# Patient Record
Sex: Female | Born: 1950 | Race: White | Hispanic: No | Marital: Single
Health system: Southern US, Community
[De-identification: ages and names within clinical notes are randomized; demographics above are authoritative.]

---

## 2008-01-09 ENCOUNTER — Ambulatory Visit: Payer: Self-pay | Admitting: Ophthalmology

## 2008-06-28 ENCOUNTER — Inpatient Hospital Stay: Payer: Self-pay | Admitting: Internal Medicine

## 2008-07-03 ENCOUNTER — Inpatient Hospital Stay: Payer: Self-pay | Admitting: Internal Medicine

## 2008-08-03 ENCOUNTER — Inpatient Hospital Stay: Payer: Self-pay | Admitting: Internal Medicine

## 2010-10-12 ENCOUNTER — Ambulatory Visit: Payer: Self-pay | Admitting: Oncology

## 2010-10-29 ENCOUNTER — Inpatient Hospital Stay: Payer: Self-pay | Admitting: Internal Medicine

## 2010-11-10 ENCOUNTER — Ambulatory Visit: Payer: Self-pay | Admitting: Oncology

## 2010-12-07 ENCOUNTER — Ambulatory Visit: Payer: Self-pay | Admitting: Gastroenterology

## 2010-12-12 LAB — PATHOLOGY REPORT

## 2010-12-15 ENCOUNTER — Emergency Department: Payer: Self-pay | Admitting: Emergency Medicine

## 2011-05-12 ENCOUNTER — Ambulatory Visit: Payer: Self-pay | Admitting: Physician Assistant

## 2012-01-01 IMAGING — NM NUCLEAR MEDICINE WHOLE BODY BONE SCINTIGRAPHY
4 series · 16 of 16 positions shown · non-contrast
Comparison: none

RESULT:      The patient received a dose of 21.32 mCi of technetium 99 M
MDP. Anterior and posterior whole body images are obtained. The patient has
no previous exam for comparison.

There is a history of bilateral hip replacements. Is a history of abnormal
radiographic bone survey. Images include oblique views over the thoracic
region and lateral views of the calvarium and cervical spine and
demonstrates increased localization in the anterior left fifth rib near the
costochondral junction which is nonspecific. There is increased localization
symmetrically in the shoulders and in the nasal and left ethmoid region and
in the knees, left greater than right as well is in the feet and ankles. The
changes in the joints appear to be most likely secondary to degenerative
disease. The appearance in the costochondral junction is nonspecific.
Specifically, no abnormal localization is seen in the area of the distal
femur. The forearms are not completely included. No abnormal localization is
suggested in the calvarium on the right lateral view. There is some minimal
increased localization in the frontal region on the left lateral view
superiorly and anteriorly. This is not seen normal on the frontal or
posterior views. Degenerative uptake is seen in the cervical spine
especially to the left. There is minimal increased localization in a
posterior left rib that appears to be the eighth rib.

[Series 1000: statics (reformatted series) · 2.40mm/px · 3 acquisitions, 6 frames shown]
[im 1/3]
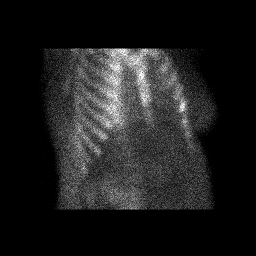
[im 1/3]
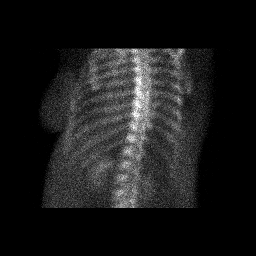
[im 2/3]
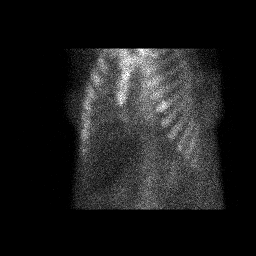
[im 2/3]
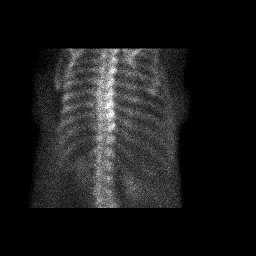
[im 3/3]
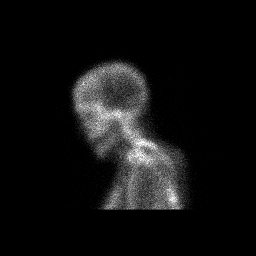
[im 3/3]
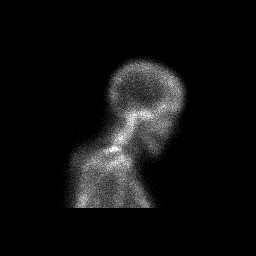

[Series 1000: 3 hr wholebody · 2.40mm/px · 2 of 2 frames shown]
[frame 1/2]
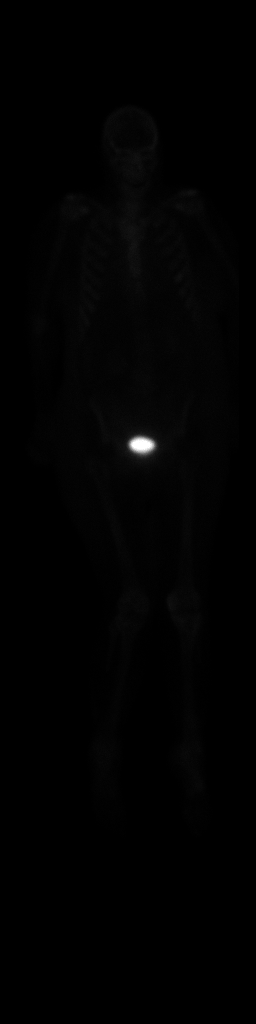
[frame 2/2]
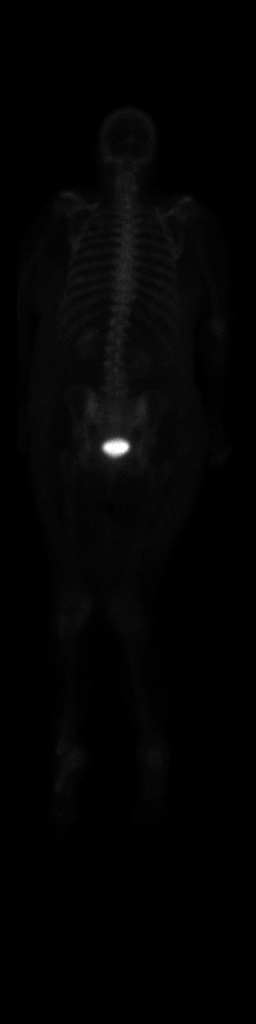

[Series 1000: 3 hr wholebody (reformatted series) · 2.40mm/px · 2 of 2 frames shown]
[frame 1/2]
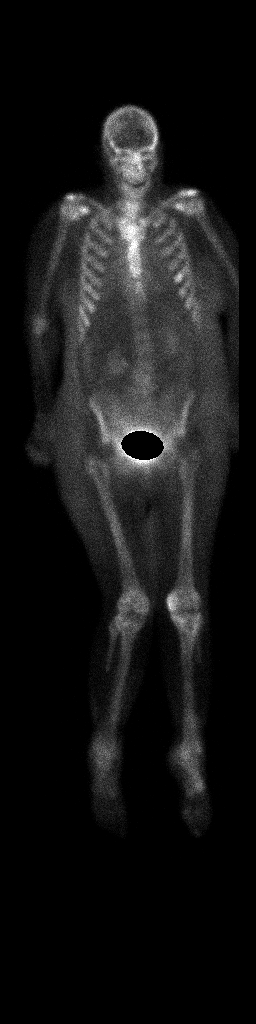
[frame 2/2]
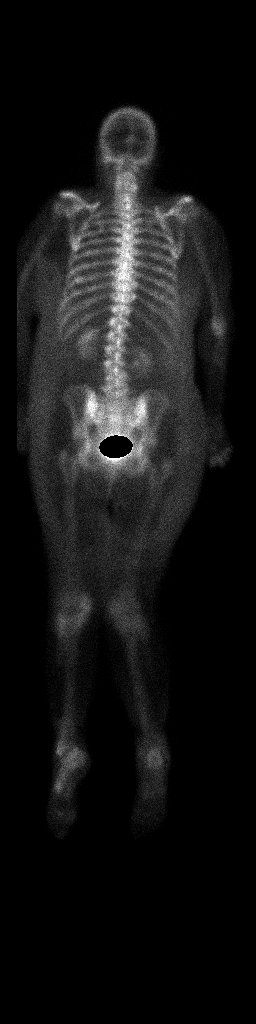

[Series 1000: statics · 2.40mm/px · 3 acquisitions, 6 frames shown]
[im 1/3]
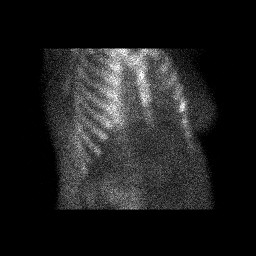
[im 1/3]
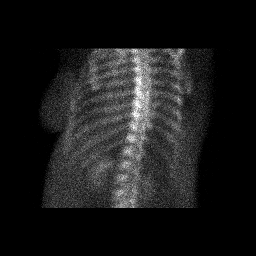
[im 2/3]
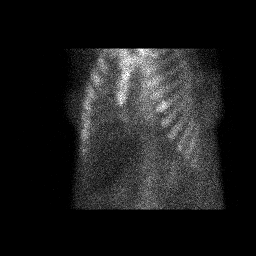
[im 2/3]
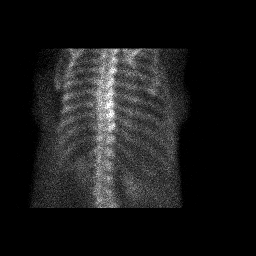
[im 3/3]
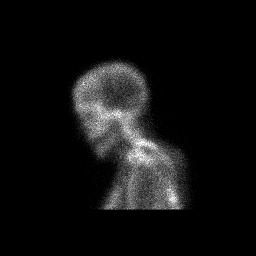
[im 3/3]
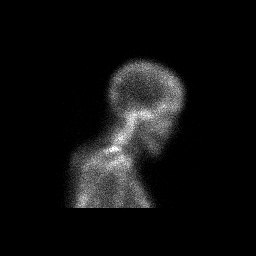

[16 of 16 positions shown; findings below may reference images not displayed]

IMPRESSION: 1. Nonspecific areas of localization. The changes in the joints in the
shoulders, knees, ankles and feet in the cervical spine are likely
degenerative. The findings in the calvarium and ribs are nonspecific. No
abnormal localization is seen in the extremities other than the degenerative
joint changes but the forearms are incompletely included. Metastatic disease
is not completely excluded. Is there a known primary malignancy? The changes
could be secondary to costochondritis or old rib trauma. Clinical
correlation and followup is recommended.

## 2012-06-10 ENCOUNTER — Inpatient Hospital Stay: Payer: Self-pay | Admitting: Student

## 2012-06-10 LAB — COMPREHENSIVE METABOLIC PANEL
Albumin: 2 g/dL — ABNORMAL LOW (ref 3.4–5.0)
Anion Gap: 11 (ref 7–16)
Bilirubin,Total: 1.2 mg/dL — ABNORMAL HIGH (ref 0.2–1.0)
Calcium, Total: 7.3 mg/dL — ABNORMAL LOW (ref 8.5–10.1)
Chloride: 89 mmol/L — ABNORMAL LOW (ref 98–107)
EGFR (African American): >60
Osmolality: 268 (ref 275–301)
Potassium: 3.4 mmol/L — ABNORMAL LOW (ref 3.5–5.1)
SGOT(AST): 26 U/L (ref 15–37)
Sodium: 135 mmol/L — ABNORMAL LOW (ref 136–145)
Total Protein: 5.8 g/dL — ABNORMAL LOW (ref 6.4–8.2)

## 2012-06-10 LAB — CBC
HGB: 10.7 g/dL — ABNORMAL LOW (ref 12.0–16.0)
MCHC: 36 g/dL (ref 32.0–36.0)
Platelet: 115 10*3/uL — ABNORMAL LOW (ref 150–440)
RBC: 2.26 10*6/uL — ABNORMAL LOW (ref 3.80–5.20)

## 2012-06-10 LAB — IRON AND TIBC
Iron Bind.Cap.(Total): 138 ug/dL — ABNORMAL LOW (ref 250–450)
Iron Saturation: 96 %
Iron: 133 ug/dL (ref 50–170)
Unbound Iron-Bind.Cap.: 5 ug/dL

## 2012-06-10 LAB — TSH: Thyroid Stimulating Horm: 0.615 u[IU]/mL

## 2012-06-10 LAB — FOLATE: Folic Acid: 1.1 ng/mL — ABNORMAL LOW (ref 3.1–100.0)

## 2012-06-11 LAB — CBC WITH DIFFERENTIAL/PLATELET
Eosinophil #: 0 10*3/uL (ref 0.0–0.7)
HCT: 22.5 % — ABNORMAL LOW (ref 35.0–47.0)
Lymphocyte #: 0.7 10*3/uL — ABNORMAL LOW (ref 1.0–3.6)
Lymphocyte %: 19.8 %
MCHC: 35.2 g/dL (ref 32.0–36.0)
Monocyte #: 0.2 x10 3/mm (ref 0.2–0.9)
Monocyte %: 4.5 %
Neutrophil #: 2.8 10*3/uL (ref 1.4–6.5)
RBC: 1.72 10*6/uL — ABNORMAL LOW (ref 3.80–5.20)
RDW: 15.9 % — ABNORMAL HIGH (ref 11.5–14.5)
WBC: 3.7 10*3/uL (ref 3.6–11.0)

## 2012-06-11 LAB — BASIC METABOLIC PANEL
Anion Gap: 10 (ref 7–16)
Chloride: 99 mmol/L (ref 98–107)
Co2: 28 mmol/L (ref 21–32)
Creatinine: 0.44 mg/dL — ABNORMAL LOW (ref 0.60–1.30)
EGFR (African American): >60
Osmolality: 270 (ref 275–301)
Sodium: 137 mmol/L (ref 136–145)

## 2012-06-12 LAB — HEPATIC FUNCTION PANEL A (ARMC)
Bilirubin, Direct: 0.2 mg/dL (ref 0.00–0.20)
SGOT(AST): 27 U/L (ref 15–37)
SGPT (ALT): 7 U/L — ABNORMAL LOW (ref 12–78)
Total Protein: 4.6 g/dL — ABNORMAL LOW (ref 6.4–8.2)

## 2012-06-12 LAB — CBC WITH DIFFERENTIAL/PLATELET
Basophil #: 0 10*3/uL (ref 0.0–0.1)
Eosinophil #: 0.1 10*3/uL (ref 0.0–0.7)
Eosinophil %: 2.9 %
HCT: 29.6 % — ABNORMAL LOW (ref 35.0–47.0)
HGB: 10.3 g/dL — ABNORMAL LOW (ref 12.0–16.0)
Lymphocyte %: 23.9 %
MCH: 41.5 pg — ABNORMAL HIGH (ref 26.0–34.0)
MCHC: 35 g/dL (ref 32.0–36.0)
Monocyte #: 0.2 x10 3/mm (ref 0.2–0.9)
Neutrophil %: 67.3 %
Platelet: 92 10*3/uL — ABNORMAL LOW (ref 150–440)

## 2012-06-12 LAB — BASIC METABOLIC PANEL
BUN: 6 mg/dL — ABNORMAL LOW (ref 7–18)
Calcium, Total: 6.5 mg/dL — CL (ref 8.5–10.1)
Chloride: 107 mmol/L (ref 98–107)
Co2: 25 mmol/L (ref 21–32)
Creatinine: 0.6 mg/dL (ref 0.60–1.30)
EGFR (African American): >60
Osmolality: 276 (ref 275–301)
Potassium: 5.2 mmol/L — ABNORMAL HIGH (ref 3.5–5.1)

## 2012-06-13 LAB — HEMOGLOBIN: HGB: 10.8 g/dL — ABNORMAL LOW (ref 12.0–16.0)

## 2012-06-13 LAB — BASIC METABOLIC PANEL
Anion Gap: 5 — ABNORMAL LOW (ref 7–16)
Co2: 27 mmol/L (ref 21–32)
Creatinine: 0.73 mg/dL (ref 0.60–1.30)
EGFR (African American): >60
EGFR (Non-African Amer.): >60
Glucose: 79 mg/dL (ref 65–99)
Potassium: 4.6 mmol/L (ref 3.5–5.1)
Sodium: 138 mmol/L (ref 136–145)

## 2012-06-14 ENCOUNTER — Observation Stay: Payer: Self-pay | Admitting: Internal Medicine

## 2012-06-14 LAB — URINALYSIS, COMPLETE
Glucose,UR: NEGATIVE mg/dL (ref 0–75)
Ketone: NEGATIVE
Nitrite: POSITIVE
Ph: 6 (ref 4.5–8.0)
RBC,UR: 5 /HPF (ref 0–5)
Squamous Epithelial: 1
WBC UR: 23 /HPF (ref 0–5)

## 2012-06-14 LAB — COMPREHENSIVE METABOLIC PANEL
Albumin: 1.8 g/dL — ABNORMAL LOW (ref 3.4–5.0)
Alkaline Phosphatase: 180 U/L — ABNORMAL HIGH (ref 50–136)
Anion Gap: 8 (ref 7–16)
BUN: 4 mg/dL — ABNORMAL LOW (ref 7–18)
Bilirubin,Total: 0.8 mg/dL (ref 0.2–1.0)
Calcium, Total: 7.6 mg/dL — ABNORMAL LOW (ref 8.5–10.1)
Chloride: 98 mmol/L (ref 98–107)
Creatinine: 0.73 mg/dL (ref 0.60–1.30)
EGFR (African American): >60
Glucose: 87 mg/dL (ref 65–99)
Potassium: 3.3 mmol/L — ABNORMAL LOW (ref 3.5–5.1)
Total Protein: 5.4 g/dL — ABNORMAL LOW (ref 6.4–8.2)

## 2012-06-14 LAB — CBC
HCT: 34.1 % — ABNORMAL LOW (ref 35.0–47.0)
HGB: 11.8 g/dL — ABNORMAL LOW (ref 12.0–16.0)
MCH: 40.5 pg — ABNORMAL HIGH (ref 26.0–34.0)
MCHC: 34.7 g/dL (ref 32.0–36.0)
MCV: 117 fL — ABNORMAL HIGH (ref 80–100)
RDW: 27.6 % — ABNORMAL HIGH (ref 11.5–14.5)

## 2012-06-15 LAB — POTASSIUM: Potassium: 3.1 mmol/L — ABNORMAL LOW (ref 3.5–5.1)

## 2012-06-16 LAB — BASIC METABOLIC PANEL
Anion Gap: 10 (ref 7–16)
Calcium, Total: 7.5 mg/dL — ABNORMAL LOW (ref 8.5–10.1)
Co2: 25 mmol/L (ref 21–32)
EGFR (African American): >60
EGFR (Non-African Amer.): >60
Glucose: 85 mg/dL (ref 65–99)
Osmolality: 276 (ref 275–301)
Potassium: 4.2 mmol/L (ref 3.5–5.1)

## 2012-06-16 LAB — URINE CULTURE

## 2012-06-17 LAB — CBC WITH DIFFERENTIAL/PLATELET
Basophil #: 0 10*3/uL (ref 0.0–0.1)
Basophil %: 0.7 %
Basophil %: 0.6 %
Eosinophil %: 5.8 %
Eosinophil %: 3.9 %
HCT: 30.8 % — ABNORMAL LOW (ref 35.0–47.0)
HGB: 9.6 g/dL — ABNORMAL LOW (ref 12.0–16.0)
Lymphocyte %: 17.9 %
Lymphocyte %: 16.3 %
MCH: 40.2 pg — ABNORMAL HIGH (ref 26.0–34.0)
MCHC: 35.6 g/dL (ref 32.0–36.0)
MCV: 116 fL — ABNORMAL HIGH (ref 80–100)
Monocyte #: 0.9 x10 3/mm (ref 0.2–0.9)
Monocyte %: 13.4 %
Monocyte %: 10.7 %
Neutrophil #: 5.6 10*3/uL (ref 1.4–6.5)
Neutrophil %: 62.2 %
Platelet: 219 10*3/uL (ref 150–440)
Platelet: 375 10*3/uL (ref 150–440)
RBC: 2.32 10*6/uL — ABNORMAL LOW (ref 3.80–5.20)
RBC: 2.65 10*6/uL — ABNORMAL LOW (ref 3.80–5.20)
RDW: 26 % — ABNORMAL HIGH (ref 11.5–14.5)

## 2013-04-23 ENCOUNTER — Emergency Department: Payer: Self-pay | Admitting: Emergency Medicine

## 2013-05-09 ENCOUNTER — Observation Stay: Payer: Self-pay | Admitting: Internal Medicine

## 2013-05-09 LAB — BASIC METABOLIC PANEL
Anion Gap: 9 (ref 7–16)
BUN: 6 mg/dL — ABNORMAL LOW (ref 7–18)
Calcium, Total: 9 mg/dL (ref 8.5–10.1)
Chloride: 100 mmol/L (ref 98–107)
EGFR (African American): >60
EGFR (Non-African Amer.): >60
Glucose: 77 mg/dL (ref 65–99)
Osmolality: 268 (ref 275–301)
Potassium: 4.1 mmol/L (ref 3.5–5.1)

## 2013-05-09 LAB — URINALYSIS, COMPLETE
Bacteria: NONE SEEN
Bilirubin,UR: NEGATIVE
Blood: NEGATIVE
Glucose,UR: NEGATIVE mg/dL (ref 0–75)
Nitrite: NEGATIVE
Ph: 5 (ref 4.5–8.0)
Protein: NEGATIVE
RBC,UR: NONE SEEN /HPF (ref 0–5)
Squamous Epithelial: NONE SEEN
WBC UR: <1 /HPF (ref 0–5)

## 2013-05-09 LAB — CBC
HCT: 37 % (ref 35.0–47.0)
HGB: 12.9 g/dL (ref 12.0–16.0)
MCH: 35.5 pg — ABNORMAL HIGH (ref 26.0–34.0)
MCHC: 34.8 g/dL (ref 32.0–36.0)
MCV: 102 fL — ABNORMAL HIGH (ref 80–100)
Platelet: 332 10*3/uL (ref 150–440)
RDW: 13.2 % (ref 11.5–14.5)

## 2013-12-04 ENCOUNTER — Emergency Department: Payer: Self-pay | Admitting: Emergency Medicine

## 2013-12-04 LAB — ETHANOL
ETHANOL %: 0.222 % — AB (ref 0.000–0.080)
ETHANOL LVL: 222 mg/dL

## 2013-12-04 LAB — CBC
HCT: 49.3 % — ABNORMAL HIGH (ref 35.0–47.0)
HGB: 16.2 g/dL — AB (ref 12.0–16.0)
MCH: 37.6 pg — AB (ref 26.0–34.0)
MCHC: 33 g/dL (ref 32.0–36.0)
MCV: 114 fL — ABNORMAL HIGH (ref 80–100)
PLATELETS: 192 10*3/uL (ref 150–440)
RBC: 4.31 10*6/uL (ref 3.80–5.20)
RDW: 14.5 % (ref 11.5–14.5)
WBC: 7 10*3/uL (ref 3.6–11.0)

## 2013-12-04 LAB — BASIC METABOLIC PANEL
ANION GAP: 6 — AB (ref 7–16)
BUN: 8 mg/dL (ref 7–18)
CALCIUM: 9.1 mg/dL (ref 8.5–10.1)
CO2: 31 mmol/L (ref 21–32)
Chloride: 98 mmol/L (ref 98–107)
Creatinine: 0.35 mg/dL — ABNORMAL LOW (ref 0.60–1.30)
EGFR (African American): >60
EGFR (Non-African Amer.): >60
GLUCOSE: 80 mg/dL (ref 65–99)
Osmolality: 267 (ref 275–301)
POTASSIUM: 5.5 mmol/L — AB (ref 3.5–5.1)
Sodium: 135 mmol/L — ABNORMAL LOW (ref 136–145)

## 2014-12-11 DEATH — deceased

## 2014-12-29 NOTE — Consult Note (Signed)
Pt CXR showed infiltrates in L lung, possible bronchitis.  She had a fall in hgb with rehydration, I did a rectal in endo and she had soft brown stool.  I cancelled her EGD due to low BP systolic in low 80's  and the abnormal CXR.  Recommend CT of chest since she is a smoker and has been losing weight and has abnormal CXR.  May reschedule EGD in a day or two, will see how she does with diet.  EGD a year ago was supposedly normal, done by Dr. Niel HummerIftikhar, I will try to look it up.  Check celiac panel due to very low folic acid (likely alcohol related).  Electronic Signatures: Scot JunElliott, Robert T (MD)  (Signed on 01-Oct-13 12:13)  Authored  Last Updated: 01-Oct-13 12:13 by Scot JunElliott, Robert T (MD)

## 2014-12-29 NOTE — H&P (Signed)
PATIENT NAME:  Kelly Day, Kelly Day MR#:  [id number] DATE OF BIRTH:  11/25/1950  DATE OF ADMISSION:  06/10/2012  PRIMARY CARE PHYSICIAN: Clayborn Bigness, MD  ED REFERRING PHYSICIAN: Daryel November, MD  CHIEF COMPLAINT: Nausea, vomiting, and diarrhea.   HISTORY OF PRESENT ILLNESS: The patient is a 64 year old white female who has past medical history of alcohol abuse, nicotine addiction, and history of gastritis who was hospitalized in February 2012 with hypoglycemia, also at that time had nausea, vomiting, and diarrhea, who reports that over the past few weeks she started again having severe nausea and vomiting. She has been throwing up everything that she eats and unable to keep any food. She has been throwing up at least 6 to 8 times a day and has not been able to eat much. She also started to have liquid diarrhea which she is having a few times a day for the past one week. She has not been recently on any antibiotics. She has not had any abdominal pain. She denies any blood in the stool or blood when she throws up. She otherwise denies any fevers or chills and no chest pain or shortness of breath. She does report that her whole back hurts. She has been very weak and her legs give way when she tries to walk. She also complains of loss of balance.   PAST MEDICAL HISTORY:  1. History of hypothyroidism.  2. History of alcohol abuse, reports that she has not had anything to drink in the past four months.  3. Tobacco abuse. 4. Peripheral neuropathy.  5. History of iron deficiency requiring iron transfusion during previous hospitalization. 6. History of vitamin B12 deficiency during previous hospitalization.  7. History of thrombocytopenia secondary to alcoholic liver disease. 8. 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 9. History of thiamine and folic acid deficiency.  10. History of depression. 11. History of fall during hospitalization with fibula fracture seen on x-ray, but  nothing clinically on examination.   PAST SURGICAL HISTORY:  1. Status post appendectomy.  2. 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  3. Tubal ligation.  ALLERGIES: Morphine.   CURRENT HOME MEDICATIONS:  1. Acetaminophen/hydrocodone 500/5 mg one tablet p.o. three times daily. 2. Cyproheptadine 4 mg one tab p.o. twice a day. 3. Gabapentin 400 mg, one tablet in the a.m. and two tablets at bedtime. 4. Levothyroxine 75 mcg daily.   SOCIAL HISTORY: She continues to smoke about one pack per day, denies alcohol drinking over the past four months, no drug use, and lives by herself.  FAMILY HISTORY: The patient reveals that her mother is alive. Her father has passed away.   REVIEW OF SYSTEMS: CONSTITUTIONAL: Denies any fevers or fatigue. Complains of generalized weakness and pain. EYES: No blurred vision. No double vision. No cataracts. No glaucoma. ENT: No tinnitus. No ear pain. No hearing loss. No epistaxis. No discharge. RESPIRATORY: Denies any coughing, wheezing, or hemoptysis. CARDIOVASCULAR: Denies any chest pain. No orthopnea. No palpitations. GI: Complains of nausea, vomiting, and diarrhea. No abdominal pain or hematemesis. GU: Denies any dysuria, hematuria, renal calculus, or frequency. ENDOCRINE: Denies any polyuria or nocturia. Has history of hypothyroidism. No heat or cold intolerance. HEMATOLOGIC: Positive for anemia. No easy bruisability or bleeding. SKIN: Reports that she has very dry skin. MUSCULOSKELETAL: Complains of pain in her back. NEUROLOGICAL: Complains of numbness in both of her feet. No asymmetrical weakness. No cerebrovascular accident. No seizures. No transient ischemic attacks in  the past. PSYCHIATRIC: No anxiety, insomnia or depression.   PHYSICAL EXAMINATION:   VITAL SIGNS: Temperature 98.3, pulse 103, respirations 16, and blood pressure initially 98/61.   GENERAL: The patient is an elderly looking female older than  her stated age, debilitated looking, and currently not in any acute distress, appears a little pale.   HEENT: Head atraumatic, normocephalic. Pupils are equally round and reactive to light and accommodation. There is no conjunctival pallor. No scleral icterus. Nasal exam shows no drainage or ulceration. Oropharynx is clear without any exudate.   NECK: No thyromegaly. No carotid bruits.   CARDIOVASCULAR: Regular rate and rhythm. No murmurs, rubs, clicks, or gallops. PMI is not displaced.   LUNGS: Clear to auscultation bilaterally without any rales, rhonchi, or wheezing.   ABDOMEN: Soft, nontender, and nondistended. Positive bowel sounds x4. There is no hepatosplenomegaly.   EXTREMITIES: No clubbing, cyanosis, or edema.   SKIN: Skin is very dry.   LYMPHATICS: No lymph nodes palpable.   MUSCULOSKELETAL: There is no erythema or swelling.   VASCULAR: Good DP and PT pulses.   PSYCHIATRIC: Not anxious or depressed.   NEUROLOGICAL: Awake, alert, and oriented x3. No focal deficits.   LABS/RADIOLOGIC STUDIES: In the ED glucose was 87, BUN 9, creatinine 0.56, sodium 135, potassium 3.4, chloride 89, CO2 35, and calcium 7.3. Lipase 40. LFTs: Total protein 5.8, albumin 2.0, bilirubin total 1.2, alkaline phosphatase 177, AST 26, and ALT 8. WBC 4.8, hemoglobin 10.7, and platelet count 115.   EKG shows sinus tachycardia.   ASSESSMENT AND PLAN: The patient is a 64 year old white female with history of alcohol abuse in the past, no alcohol for the past four months, who presents with nausea, vomiting, and diarrhea for one week now.  1. Nausea, vomiting, and diarrhea of unclear cause, likely gastroenteritis due to prolonged duration. At this time, we will give her IV fluids and antiemetics. We will check her stool for C. difficile and stool cultures. I will have gastroenterology come evaluate the patient. We will also check a TSH.  2. Severe dehydration. The patient is tachycardic and appears volume  depleted. We will give her IV fluids.  3. Anemia with severe macrocytosis. Hemoglobin is likely much lower due to dehydration. At this time, we will give her IV fluids and check B12, folate, iron, and ferritin level due to her history of iron deficiency.  4. Hypokalemia. We will replace her potassium.  5. Alcohol abuse. None now for four months. We will monitor for any signs of withdrawal. We will start her on CIWA if there is any concern for withdrawal.  6. Nicotine addiction. The patient is counseled regarding smoking cessation for greater than three minutes. Nicotine patch offered. The patient wants to do nicotine patch while in the hospital.   TIME SPENT: 45 minutes. ____________________________ Lacie ScottsShreyang H. Allena KatzPatel, MD shp:slb D: 06/10/2012 16:16:26 ET T: 06/10/2012 16:47:35 ET JOB#: [id number]  cc: Krislynn Gronau H. Allena KatzPatel, MD, <Dictator> Burley SaverL. [name], MD Charise CarwinSHREYANG H Ricky Gallery MD ELECTRONICALLY SIGNED 06/11/2012 18:12

## 2014-12-29 NOTE — Discharge Summary (Signed)
PATIENT NAME:  Kelly Day, Kelly Day MR#:  [id number] DATE OF BIRTH:  06-08-1951  DATE OF ADMISSION:  06/10/2012 DATE OF DISCHARGE:  06/13/2012  PRIMARY CARE PHYSICIAN: Dr. Quillian QuinceBliss   CONSULTANT:  Dr. Mechele CollinElliott  CHIEF COMPLAINT: Nausea, vomiting, diarrhea.   DISCHARGE DIAGNOSES:  1. Nausea, vomiting, diarrhea, unclear etiology, currently resolved.  2. Severe malnutrition and dehydration.  3. Hypokalemia and bout of hyperkalemia.  4. Hypomagnesemia.  5. Folate deficiency.  6. Vitamin D deficiency.  7. Acute on chronic anemia with macrocytosis without acute bleed.  8. Hypothyroidism.  9. History of iron deficiency requiring iron transfusion in the past.  10. History of vitamin B12 deficiency in the past.  11. 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  12. Peripheral neuropathy.  13. Tobacco abuse.  14. History of prolonged alcohol abuse, last drink four months ago.  15. Depression.   DISCHARGE MEDICATIONS:  1. Gabapentin 400 mg one cap in the morning, one cap in the evening, and 2 caps at bedtime. 2. Cyproheptadine 4 mg 2 times a Day.  3. Levothyroxine 75 mcg daily.  4. Folic acid 1 mg once a Day.  5. Vitamin D2 50,000 international units once a week.  6. Multivitamin 1 tab daily.  7. Albuterol, CFC free, 90 mcg inhaled 2 puffs 4 times a Day as needed for wheezing.   DISPOSITION: Home with resumption of Home Health including PT and R.N.   DIET: Regular.   ACTIVITY: As tolerated.   FOLLOWUP: Please follow with primary care physician for BMP and CBC check within 1 to 2 weeks. Please follow up with Dr. Mechele CollinElliott of gastroenterology in 1 to 2 weeks.   CODE STATUS: FULL CODE.   HISTORY OF PRESENT ILLNESS: For full details of history and physical, please see the dictation on 06/10/2012 by Dr. Allena KatzPatel. Briefly this is a 64 year old with alcohol and nicotine addictions in the past who had stopped alcohol use about four months ago, history of gastritis, who presented with  the above chief complaint. She complained of liquid diarrhea and p.o. intolerance and had severe dehydration and malnutrition and was admitted to the hospitalist service on 06/10/2012.   SIGNIFICANT LABS AND IMAGING: Initial folic acid 1.1, potassium 3.4, peak potassium 5.2, last potassium 4.6. Initial chloride 89, serum CO2 35, creatinine 0.56 on arrival. Serum iron 133, BUN on arrival 9, lipase on arrival 40. Initial total bilirubin 1.2 and  then 0.7. Initial alkaline phosphatase 177, then 131, AST 26 on arrival and ALT 8. TSH 0.615 on arrival. Initial WBC of 4.8, hemoglobin 10.7, lowest hemoglobin 7.9, and last hemoglobin was 10.8. MCV 132 on arrival, and platelets were 115 on arrival. Vitamin B12 was 498, ionized calcium 4.2. Celiac panel negative and vitamin D level was low at 4.6. X-ray of the chest PA and lateral was with findings consistent with chronic obstructive pulmonary disease. No acute pneumonia. Ultrasound of the abdomen showing sludge within the gallbladder but there is no sonographic evidence of acute cholecystitis. Fatty infiltrative changes of the liver.   HOSPITAL COURSE: The patient was admitted to the hospitalist service and started on IV fluid resuscitation and GI was consulted for her chronic symptoms. Given her chronic nature of symptoms lasting several weeks, a viral gastroenteritis was less likely. Stool cultures were ordered and the patient was gradually advanced in her diet. The patient did not have any significant vomiting or diarrhea, and although stool cultures were ordered none were sent given resolution of her symptoms. She was found with severe  dehydration and malnutrition with a low albumin and multiple electrolyte abnormalities which were repleted as needed. She did have a drop in her hemoglobin. It appears that her initial hemoglobin was likely hemoconcentrated even though it was in the 10 range given she was volume contracted on arrival and dehydrated. With IV fluids  hemoglobin went down to 7.9. She did require a unit of blood for symptomatic anemia and her last hemoglobin is 10.8 as of today. Paleness of skin has improved and she has been tolerating her p.o. diet. She had hypokalemia, hypomagnesemia, and vitamin D as well as folate deficiencies and all of these are being repleted. The patient was seen by physical therapy and their recommendation is home with Home Health. The patient did have bouts of hypotension likely secondary to symptomatic anemia and dehydration. They have been stable and the patient is not symptomatic currently. Her TSH was checked and within goal. At this point she will be discharged with outpatient followup with Dr. Quillian Quince and a CBC and a BMP check as well as followup with Dr. Mechele Collin from gastroenterology. An EGD was planned, however given blood pressure on the lower side, it was canceled. At this point outpatient luminal study is recommended.    CODE STATUS: FULL CODE.  TOTAL TIME SPENT: 35 minutes.   ____________________________ Krystal Eaton, MD sa:bjt D: 06/13/2012 11:49:20 ET T: 06/13/2012 14:20:08 ET JOB#: [id number]  cc: Krystal Eaton, MD, <Dictator> Burley Saver, MD Scot Jun, MD Krystal Eaton MD ELECTRONICALLY SIGNED 06/14/2012 15:12

## 2014-12-29 NOTE — H&P (Signed)
PATIENT NAME:  Kelly Day, Kelly Day DATE OF BIRTH:  08-19-1951  DATE OF ADMISSION:  06/14/2012  PRIMARY CARE PHYSICIAN: Dr. Clayborn BignessKatherine Bliss.   CHIEF COMPLAINT: Weakness.   HISTORY OF PRESENT ILLNESS: This is a 64 year old female who presents to the emergency room today due to weakness and difficulty ambulating. The patient was discharged from the hospital just yesterday due to nausea, vomiting, and diarrhea of unknown etiology and discharged home with home health nursing and physical therapy services. She got home, but the nursing services had not shown up yet. She went to use the bathroom and then could not get up from the toilet. She had to call EMS, and she was brought to the ER. She says that she can barely move around at home, and she would prefer if she could be placed to a skilled nursing facility. She does not think she can take care of herself at home. She denies any other new symptoms since being discharged yesterday. She actually says the nausea, vomiting, and diarrhea have improved. She denies any fevers, chills, cough, chest pain, shortness of breath, or any other associated symptoms presently.   REVIEW OF SYSTEMS: CONSTITUTIONAL: Positive weakness. No weight gain.  No weight loss. No fever. EYES: No blurry or double vision. ENT: No tinnitus. No postnasal drip. No redness of the oropharynx. RESPIRATORY: No cough, no wheeze, no hemoptysis. CARDIOVASCULAR: No chest pain, no orthopnea, no palpitations or syncope. GASTROINTESTINAL: No nausea, vomiting, no diarrhea, no abdominal pain, no melena, no hematochezia. GU: No dysuria or hematuria. ENDOCRINE: No polyuria or nocturia. No heat or cold intolerance. HEME: No anemia, no bruising, no bleeding. INTEGUMENTARY: No rashes. No lesions. MUSCULOSKELETAL: No arthritis, no swelling, no gout. NEUROLOGIC: No numbness, no tingling, no ataxia, no seizure-type activity. PSYCH: No anxiety, no insomnia, no ADD.   PAST MEDICAL HISTORY:  1. History  of alcohol abuse. 2. History of peripheral neuropathy.  3. History of hypothyroidism.  4. Tobacco abuse. 5. Iron deficiency anemia requiring iron transfusion the previous hospitalization. 6. Vitamin B deficiency. 7. 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  8. Vitamin D deficiency.  9. Thiamine and folic acid deficiency. 10. History of depression.   PAST SURGICAL HISTORY: Status post appendectomy, bilateral hip replacement secondary to avascular necrosis, tubal ligation.   ALLERGIES: Morphine.   SOCIAL HISTORY: Still smokes about 1/2 pack every day. Used to be a heavy drinker, quit about 4 months ago. No other illicit drug abuse. Lives by herself.   FAMILY HISTORY: The patient's father died, she cannot recall what from. Mother is alive and healthy.   1. CURRENT MEDICATIONS: Based on the recent discharge summary are as follows: Gabapentin 400 mg b.i.d. and 800 mg at bedtime. 2. Cyproheptadine 4 mg b.i.d.  3. Synthroid 75 mcg daily.  4. Folic acid 1 mg daily.  5. Ergocalciferol 50,000 international units weekly.   6. Multivitamin daily.  7. Albuterol inhaler 2 puffs q.i.d. as needed.   PHYSICAL EXAMINATION ON ADMISSION:   VITAL SIGNS: The patient's vital signs are noted to be temperature 98.4, pulse 114, respirations 24, blood pressure 89/52.   GENERAL: She is a pleasant-appearing female but in no apparent distress.   HEENT: Atraumatic, normocephalic. Her extraocular muscles were intact. The pupils are equal and reactive to light. Sclerae are anicteric. No conjunctival injection. No pharyngeal erythema.   NECK: Supple. There is no jugular venous distention, no bruits, no lymphadenopathy, no thyromegaly.   HEART: Regular rate and rhythm. No murmurs, no rubs,  no clicks.   LUNGS: Clear to auscultation bilaterally. No rales, no rhonchi, no wheezes.   ABDOMEN: Soft, flat, nontender, nondistended. Has good bowel sounds. No hepatosplenomegaly appreciated.    EXTREMITIES: No evidence of any cyanosis or clubbing. She does have +1 to 2 pitting edema from the knees to the ankles bilaterally. Plus 2 pedal and radial pulses bilaterally.   NEUROLOGIC: She is alert, awake, oriented x3. She has 2 out of 5 strength in the lower extremities bilaterally. She is barely able to lift both her extremities. She is able to wiggle her toes. She is able to dorsiflex and anteflex her ankle but other than that she barely can move her lower extremities.   SKIN: Moist, warm. She does not have any rashes. She does have skin tears on both her upper extremities, one on her right elbow which is covered with DuoDERM. Left arm, she has a dressing with a Kerlix wrapped around it.   LYMPHATIC: There is no cervical or axillary lymphadenopathy.   LABORATORY EXAM: Serum glucose of 87, BUN 4, creatinine 0.7, sodium 135, potassium 3.3, chloride 98, bicarbonate 29. Patient's liver function tests are within normal limits. White cell count 7.2, hemoglobin 11.8, hematocrit 34.1, platelet count of 173,000. Urinalysis shows positive nitrites, 23 white cells, and 3+ bacteria.   ASSESSMENT AND PLAN: This is a 64 year old female with history of alcohol abuse, posterior peripheral neuropathy, hypothyroidism, chronic obstructive pulmonary disease with ongoing tobacco abuse, vitamin D deficiency who presents to the hospital with weakness and having difficulty ambulating. She was just discharged yesterday with home health services.  1. Weakness/difficulty ambulating. The exact etiology is currently unclear but likely chronic and related to her deconditioning and worsening peripheral neuropathy, possibly complicated with underlying urinary tract infection. She was seen by Physical Therapy 2 days ago here in the hospital and discharged home with home health physical therapy and nursing services but now returns back to the hospital. I will get Physical Therapy to come back and re-evaluate the patient.  She  may need placement to short-term rehab. I have made Case Management aware. They will continue to follow as far as discharge planning is concerned.  2. Urinary tract infection. This was an incidental finding based on a urinalysis. She is not complaining of any dysuria or frequency. She is afebrile. She does not have a white cell count. I will go ahead and start her on some p.o. ciprofloxacin for now.  3. Peripheral neuropathy. Continue with Neurontin.  4. Hypothyroidism. Continue Synthroid.  5. Hypokalemia. We will go ahead and start on potassium supplements and recheck in the morning.  6. Chronic obstructive pulmonary disease with ongoing tobacco abuse. Continue her albuterol inhaler and place her on a nicotine patch.   CODE STATUS: THE PATIENT IS A FULL CODE.   TIME SPENT: 45 minutes.   ____________________________ Rolly Pancake. Cherlynn Kaiser, MD vjs:vtd D: 06/14/2012 21:36:17 ET T: 06/15/2012 06:26:35 ET JOB#: [id number]  cc: Rolly Pancake. Cherlynn Kaiser, MD, <Dictator> Burley Saver, MD Houston Siren MD ELECTRONICALLY SIGNED 06/22/2012 14:57

## 2014-12-29 NOTE — Consult Note (Signed)
CC: weakness, vomiting, diarrhea.  Pt got transfused and I agree with transfusion, color better, energy better, Vit D level of 4.6 with normal of 30, albumin of 1.6, no N/V/ D.  Abd not tender.  Severe malnutrition.  Will need nutritional and vitamin supplements.  Hold on EGD.  Consider colonoscopy later.  Electronic Signatures: Scot JunElliott, Samary Shatz T (MD)  (Signed on 02-Oct-13 18:46)  Authored  Last Updated: 02-Oct-13 18:46 by Scot JunElliott, Janel Beane T (MD)

## 2014-12-29 NOTE — Consult Note (Signed)
Since no vomiting or diarrhea I will sign off. reconsult  if needed.  Electronic Signatures: Scot JunElliott, Jeweline Reif T (MD)  (Signed on 03-Oct-13 12:27)  Authored  Last Updated: 03-Oct-13 12:27 by Scot JunElliott, Janiel Derhammer T (MD)

## 2014-12-29 NOTE — Consult Note (Signed)
Pt CC is vomiting, diarrhea, anemia, folic acid def, weight loss, malnutrition, alcohol and cig abuse,  Will do EGD today.  Consult done last night and yet to be typed, will be typed today.  US and CXR done.  Electronic Signatures: Scot JunElliott, Roi Jafari T (MD)  (Signed on 01-Oct-13 11:15)  Authored  Last Updated: 01-Oct-13 11:15 by Scot JunElliott, Gaylynn Seiple T (MD)

## 2014-12-29 NOTE — Discharge Summary (Signed)
PATIENT NAME:  Kelly Day, Kelly Day MR#:  [id number] DATE OF BIRTH:  1951/05/12  DATE OF ADMISSION:  06/14/2012 DATE OF DISCHARGE:  10/09/20213  DISCHARGE DIAGNOSES:  1. Urinary tract infection, now treated.  2. Using retention, transient and resolved.  3. Hypokalemia, repleted and resolved.  4. Hypoalbuminemia, likely due to malnutrition. Dietary recommended starting Ensure. 5. Lower extremity swelling, started on spironolactone as needed, could be due to hypoalbuminemia. No evidence of congestive heart failure.   SECONDARY DIAGNOSES:  1. History of alcohol abuse.  2. Peripheral neuropathy.  3. Hypothyroidism. 4. Iron deficiency anemia.  5. Vitamin B12 deficiency.  6. 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  7. Vitamin D deficiency.  8. Thiamine and folic acid deficiency. 9. History of depression.   CONSULTATIONS: Physical Therapy.   LABORATORY, DIAGNOSTIC AND RADIOLOGICAL DATA: Chest x-ray on October 1st  showed findings consistent with chronic obstructive pulmonary disease. None done during this hospitalization. Urinalysis on admission showed 3+ bacteria, 23 WBCs, positive nitrite. Urine culture grew more than 100,000 colonies of Escherichia coli.   HISTORY AND SHORT HOSPITAL COURSE: The patient is a 64 year old female with the above-mentioned medical problems who was admitted for weakness and difficulty with ambulation. Physical Therapy consultation was obtained who recommended short-term rehab She was also found to have urinary tract infection which could be contributing to her weakness. She was started on oral ciprofloxacin. She had some hypokalemia which was repleted and resolved. She was found to be very malnourished and was recommended Ensure by the dietitian which was started while in the hospital. She is doing fair and is being transferred to rehab. She remains at very high risk for recurrent hospitalization and possible UTIs also. She is close to her baseline today  and is being discharged in stable condition.   PERTINENT DISCHARGE PHYSICAL EXAMINATION: VITAL SIGNS: On the date of discharge, her temperature is 98, heart rate 96 per minute, respirations 18 per minute, blood pressure 101/64 mmHg. She is saturating 95% on room air. CARDIOVASCULAR: S1, S2 normal. No murmurs, rubs, or gallops. LUNGS: Clear to auscultation bilaterally. No wheezing, rales, rhonchi, or crepitation. ABDOMEN: Soft, benign. NEUROLOGIC: Nonfocal examination. All other physical examination remained at the baseline.   DISCHARGE MEDICATIONS:  1. Gabapentin 400 mg p.o. every morning, 1 in the evening and 2 at bedtime. 2. Cyproheptadine 4 mg p.o. b.i.d.  3. Levothyroxine 75 mcg p.o. daily.  4. Folic acid 1 mg p.o. daily. 5. Ergocalciferol 50,000 international units once a week.  6. Multivitamin once daily.  7. Albuterol 2 puffs inhaled q.i.d. as needed.  8. Vicodin 5/500, 1 tablet p.o. every 4 hours.  9. Spironolactone 25 mg p.o. daily as needed for shortness of breath and lower extremity edema.   DISCHARGE DIET: Low sodium. Please provide Ensure t.i.d. with her meals  DISCHARGE ACTIVITY: As tolerated.   DISCHARGE INSTRUCTIONS AND FOLLOWUP:   1. The patient was instructed to follow up with her primary care physician, Dr. Clayborn BignessKatherine Bliss, in 1 to 2 weeks.  2. She will need Physical Therapy evaluation and management while at the facility.   TOTAL TIME DISCHARGING THIS PATIENT: 55 minutes.  ____________________________ Ellamae SiaVipul S. Sherryll BurgerShah, MD vss:cbb D: 06/19/2012 12:45:02 ET T: 06/19/2012 13:11:45 ET JOB#: [id number] cc: Burley SaverL. [name], MD Patricia PesaVIPUL S Traivon Morrical MD ELECTRONICALLY SIGNED 06/19/2012 19:33

## 2015-01-01 NOTE — Discharge Summary (Signed)
PATIENT NAME:  Kelly Day, [name] MR#:  [id number] DATE OF BIRTH:  22-Jul-1951  DATE OF ADMISSION:  05/09/2013 DATE OF DISCHARGE:  05/11/2013  PRIMARY CARE PHYSICIAN: Clayborn BignessKatherine Bliss, MD  DISCHARGE DIAGNOSIS: Bilateral leg weakness and low back pain.   CONDITION: Stable.   CODE STATUS: FULL CODE.   HOME MEDICATIONS: [facility] reconciliation [facility].   ACTIVITY: The patient needs home health with home physical therapy.   DIET: Regular diet.   ACTIVITY: As tolerated.   FOLLOWUP CARE:  Follow up with PCP within 1 to 2 weeks.   REASON FOR ADMISSION: Low back pain.   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 For detailed history and physical examination, please refer to the admission note dictated by Dr. Clint GuyHower.  The patient's laboratory data was in normal range on admission date. After admission the patient has been treated with Toradol IV p.r.n. with Norco. In addition, the patient got physical therapy evaluation. According to physical therapy evaluation, the patient needs home health and physical therapy. The patient's low back pain has much improved, but she still has bilateral leg weakness. The patient is clinically stable and will be discharged to home with home health and PT. Discussed the patient's discharge plan with the patient, case manager and nurse.   TIME SPENT: About 35 minutes.   ____________________________ Shaune PollackQing Janari Gagner, MD qc:sb D: 05/11/2013 14:42:24 ET T: 05/12/2013 08:00:15 ET JOB#: [id number]  cc: Shaune PollackQing Toniesha Zellner, MD, <Dictator> Shaune PollackQING Briseidy Spark MD ELECTRONICALLY SIGNED 05/12/2013 16:06

## 2015-01-01 NOTE — H&P (Signed)
PATIENT NAME:  Kelly Day, Kelly Day MR#:  [id number] DATE OF BIRTH:  01-02-1951  DATE OF ADMISSION:  05/09/2013  PRIMARY CARE PHYSICIAN:  Dr. Quillian Quince   REFERRING PHYSICIAN:  Dr. Cyril Loosen.   HISTORY OF PRESENT ILLNESS:  Ms. Kelly Day is a 64 year old Caucasian female with a past medical history of avascular necrosis of both hips bilaterally, secondary to alcohol abuse, requiring bilateral hip replacement in the 90s with resultant chronic lower back pain, as well as peripheral neuropathy. She also has a history of tobacco abuse, hypothyroidism. She is presenting with acute on chronic low back pain after a mechanical fall approximately 2 weeks ago. Her pain is constant over the lower back region, worse in the left side with radiation to the left buttock. She has found no worsening or relieving factors. She now also complains of bilateral leg weakness and inability to ambulate. Denies any urinary or stool incontinence. She has described the intensity ranging between 7 to 8 out of 10 in intensity and fairly constant in nature.   REVIEW OF SYSTEMS: CONSTITUTIONAL: Denies any fevers, chills, weight changes.  EYES: No vision changes. ENT AND MOUTH: No oral lesions.  CARDIOVASCULAR: No chest pain, palpitations.  RESPIRATORY: No shortness of breath.  GASTROINTESTINAL: No nausea, vomiting, diarrhea. GENITOURINARY: No urinary incontinence, dysuria.  MUSCULOSKELETAL: Describes pain and weakness as above.  SKIN: No rashes or lesions.  NEUROLOGICAL: Aside from gross weakness of the legs, no paresthesias or paralysis. PSYCHIATRIC: No depressive symptoms. No homicidal or suicidal ideation.  ENDOCRINE: No fatigue, weight changes, urinary changes. HEMATOLOGIC/LYMPHATICS: No bleeding, bruising. ALLERGY AND IMMUNOLOGY: No symptoms.  Otherwise, full review of systems performed by me is negative.   PAST MEDICAL HISTORY: Peripheral neuropathy, hypothyroidism, tobacco abuse, iron deficiency anemia, vitamin B deficiency,  vitamin D deficiency, and folate acid deficiency, as well as depression. She also has a history of chronic low back pain.   PAST SURGICAL HISTORY: Appendectomy, bilateral hip replacement secondary to avascular necrosis, as well as tubal ligation.   ALLERGIES:  TO MORPHINE WHICH CAUSES RASH.   SOCIAL HISTORY: Current tobacco user, approximately 1/2 pack a day. She was previously a heavy drinker. Now, states she drinks the occasional wine or beer. Denies any other drug use. She lives by herself.   FAMILY HISTORY: Negative for any cardiovascular disease or back disease.   CURRENT MEDICATIONS: Include; acetaminophen/hydrocodone 325/5, 1 tab q.3-4 hours p.r.n. for pain, albuterol 90 mcg inhalation 2 puffs q.4 hours as needed for shortness of breath, Colace 100 mg p.o., t.i.d., cyproheptadine 4 mg p.o. b.i.d., Flonase 50 mcg inhalation spray once daily, folic acid 1 mg p.o. daily, gabapentin 600 mg 1 tab every four hours, Synthroid 75 mcg p.o. daily, MiraLAX 17 grams daily for constipation, vitamin D 50,000 international units p.o. q. weekly.  PHYSICAL EXAMINATION: VITAL SIGNS: Temperature 99, heart rate 77, respirations 18, blood pressure 126/54, saturating 96% on room air.  GENERAL:  No acute distress, awake, alert and oriented x 3.  HEENT:  Normocephalic, atraumatic. Extraocular muscles intact. Pupils equal, round, reactive to light as well as accommodation, moist mucosal membranes.  CARDIOVASCULAR: S1, S2, regular rate and rhythm. No murmurs, rubs or gallops. PULMONARY: Clear to auscultation without wheezes, rales or rhonchi. ABDOMEN: Soft, nontender, nondistended with positive bowel sounds.  EXTREMITIES: Reveal trace to 1+ bilateral lower extremity edema throughout knee, otherwise, no cyanosis, clubbing. NEUROLOGICAL:  Cranial nerves II through XII are intact. She has gross weakness of the lower extremities, 4-/5 in the hip flexor as well as  extensors, and this is somewhat limited by pain. She  does have palpable tenderness of the perivertebral region of the left lower spine,             around L5 and lower; otherwise, sensation is intact. Reflexes are intact bilaterally.   LABORATORY DATA: Sodium 136, potassium 4.1, chloride 100, bicarb 27, BUN 6, creatinine 0.47, calcium 9, WBC 11.5, hemoglobin 12.9, hematocrit 37, platelets 332.  Urinalysis within normal limits. EKG performed; normal sinus rhythm, heart rate 95.   ASSESSMENT AND PLAN: This is a 64 year old Caucasian female, history of bilateral hip replacement secondary to avascular necrosis, as well as chronic low back pain and peripheral neuropathy, is presenting with acute on chronic back pain status post mechanical fall approximately 2 weeks ago. Pain is constant over the lower back, worse in the left side, radiation to the left buttocks. She now is presenting with inability to ambulate, as well as bilateral leg weakness.   1.  Bilateral leg weakness. We will consult PT and OT for her. She will likely need rehabilitation placement.  2.  Acute on chronic back pain, p.r.n. pain medications as required. She does take Percocet at home. She received a dose of Toradol in the Emergency Department, which seemed to benefit. Will also add Flexeril for spasm.  3.  Hypothyroidism. Continue with Synthroid.  4.  Vitamin deficiencies. Continue home vitamins.   The patient is Full Code.   TOTAL TIME SPENT: 31 minutes.     ____________________________ Cletis Athensavid K. Hower, MD dkh:nts D: 05/09/2013 22:56:00 ET T: 05/09/2013 23:16:52 ET JOB#: [id number]  cc: Cletis Athensavid K. Hower, MD, <Dictator> DAVID Synetta ShadowK HOWER MD ELECTRONICALLY SIGNED 05/10/2013 1:59
